# Patient Record
Sex: Male | Born: 1950 | Hispanic: Yes | Marital: Married | Smoking: Never smoker
Health system: Southern US, Community
[De-identification: ages and names within clinical notes are randomized; demographics above are authoritative.]

## PROBLEM LIST (undated history)

## (undated) DIAGNOSIS — F329 Major depressive disorder, single episode, unspecified: Secondary | ICD-10-CM

## (undated) DIAGNOSIS — M25569 Pain in unspecified knee: Secondary | ICD-10-CM

## (undated) DIAGNOSIS — F32A Depression, unspecified: Secondary | ICD-10-CM

## (undated) DIAGNOSIS — E119 Type 2 diabetes mellitus without complications: Secondary | ICD-10-CM

## (undated) HISTORY — PX: LACERATION REPAIR: SHX5168

## (undated) HISTORY — PX: NO PAST SURGERIES: SHX2092

## (undated) HISTORY — PX: HAND SURGERY: SHX662

---

## 2015-07-21 ENCOUNTER — Encounter (HOSPITAL_COMMUNITY): Payer: Self-pay | Admitting: Emergency Medicine

## 2015-07-21 ENCOUNTER — Emergency Department (HOSPITAL_COMMUNITY): Payer: Self-pay | Admitting: Anesthesiology

## 2015-07-21 ENCOUNTER — Inpatient Hospital Stay (HOSPITAL_COMMUNITY)
Admission: EM | Admit: 2015-07-21 | Discharge: 2015-07-22 | DRG: 581 | Disposition: A | Discharge status: Home / Self Care | Payer: Self-pay | Attending: General Surgery | Admitting: General Surgery

## 2015-07-21 ENCOUNTER — Emergency Department (HOSPITAL_COMMUNITY): Payer: Self-pay

## 2015-07-21 ENCOUNTER — Encounter (HOSPITAL_COMMUNITY): Admission: EM | Disposition: A | Discharge status: Home / Self Care | Payer: Self-pay | Source: Home / Self Care

## 2015-07-21 DIAGNOSIS — S1191XA Laceration without foreign body of unspecified part of neck, initial encounter: Secondary | ICD-10-CM | POA: Diagnosis present

## 2015-07-21 DIAGNOSIS — Y92009 Unspecified place in unspecified non-institutional (private) residence as the place of occurrence of the external cause: Secondary | ICD-10-CM

## 2015-07-21 DIAGNOSIS — Z23 Encounter for immunization: Secondary | ICD-10-CM

## 2015-07-21 DIAGNOSIS — S162XXA Laceration of muscle, fascia and tendon at neck level, initial encounter: Principal | ICD-10-CM | POA: Diagnosis present

## 2015-07-21 DIAGNOSIS — E119 Type 2 diabetes mellitus without complications: Secondary | ICD-10-CM | POA: Diagnosis present

## 2015-07-21 HISTORY — DX: Pain in unspecified knee: M25.569

## 2015-07-21 HISTORY — DX: Type 2 diabetes mellitus without complications: E11.9

## 2015-07-21 HISTORY — PX: COMPLEX WOUND CLOSURE: SHX6446

## 2015-07-21 LAB — I-STAT CHEM 8, ED
BUN: 21 mg/dL — AB (ref 6–20)
CALCIUM ION: 1.07 mmol/L — AB (ref 1.12–1.23)
CHLORIDE: 104 mmol/L (ref 101–111)
Creatinine, Ser: 1 mg/dL (ref 0.61–1.24)
Glucose, Bld: 206 mg/dL — ABNORMAL HIGH (ref 65–99)
HEMATOCRIT: 39 % (ref 39.0–52.0)
Hemoglobin: 13.3 g/dL (ref 13.0–17.0)
Potassium: 3.3 mmol/L — ABNORMAL LOW (ref 3.5–5.1)
SODIUM: 137 mmol/L (ref 135–145)
TCO2: 19 mmol/L (ref 0–100)

## 2015-07-21 LAB — POCT I-STAT 4, (NA,K, GLUC, HGB,HCT)
Glucose, Bld: 166 mg/dL — ABNORMAL HIGH (ref 65–99)
HEMATOCRIT: 32 % — AB (ref 39.0–52.0)
HEMOGLOBIN: 10.9 g/dL — AB (ref 13.0–17.0)
POTASSIUM: 3.8 mmol/L (ref 3.5–5.1)
SODIUM: 140 mmol/L (ref 135–145)

## 2015-07-21 LAB — TYPE AND SCREEN
ABO/RH(D): A POS
ANTIBODY SCREEN: NEGATIVE
Unit division: 0
Unit division: 0

## 2015-07-21 LAB — PREPARE FRESH FROZEN PLASMA
UNIT DIVISION: 0
UNIT DIVISION: 0

## 2015-07-21 LAB — ABO/RH: ABO/RH(D): A POS

## 2015-07-21 LAB — GLUCOSE, CAPILLARY
Glucose-Capillary: 116 mg/dL — ABNORMAL HIGH (ref 65–99)
Glucose-Capillary: 110 mg/dL — ABNORMAL HIGH (ref 65–99)

## 2015-07-21 LAB — CBG MONITORING, ED: GLUCOSE-CAPILLARY: 163 mg/dL — AB (ref 65–99)

## 2015-07-21 SURGERY — COMPLEX CLOSURE, WOUND
Anesthesia: General | Site: Neck

## 2015-07-21 MED ORDER — CEFAZOLIN SODIUM-DEXTROSE 2-3 GM-% IV SOLR
INTRAVENOUS | Status: AC
  Filled 2015-07-21: qty 50

## 2015-07-21 MED ORDER — OXYCODONE HCL 5 MG PO TABS
10.0000 mg | ORAL_TABLET | ORAL | Status: DC | PRN
  Administered 2015-07-21 – 2015-07-22 (×4): 10 mg via ORAL
  Filled 2015-07-21 (×4): qty 2

## 2015-07-21 MED ORDER — FENTANYL CITRATE (PF) 250 MCG/5ML IJ SOLN
INTRAMUSCULAR | Status: AC
  Filled 2015-07-21: qty 5

## 2015-07-21 MED ORDER — PROMETHAZINE HCL 25 MG/ML IJ SOLN: 6.2500 mg | INTRAMUSCULAR | Status: DC | PRN

## 2015-07-21 MED ORDER — FENTANYL CITRATE (PF) 250 MCG/5ML IJ SOLN
INTRAMUSCULAR | Status: DC | PRN
  Administered 2015-07-21: 100 ug via INTRAVENOUS
  Administered 2015-07-21 (×3): 50 ug via INTRAVENOUS

## 2015-07-21 MED ORDER — LACTATED RINGERS IV SOLN
INTRAVENOUS | Status: DC | PRN
  Administered 2015-07-21 (×2): via INTRAVENOUS

## 2015-07-21 MED ORDER — DEXTROSE-NACL 5-0.9 % IV SOLN
INTRAVENOUS | Status: DC
  Administered 2015-07-21 (×2): via INTRAVENOUS

## 2015-07-21 MED ORDER — INSULIN ASPART 100 UNIT/ML ~~LOC~~ SOLN
0.0000 [IU] | Freq: Three times a day (TID) | SUBCUTANEOUS | Status: DC
  Administered 2015-07-22: 2 [IU] via SUBCUTANEOUS

## 2015-07-21 MED ORDER — PROPOFOL 10 MG/ML IV BOLUS
INTRAVENOUS | Status: AC
  Filled 2015-07-21: qty 20

## 2015-07-21 MED ORDER — LIDOCAINE HCL (CARDIAC) 20 MG/ML IV SOLN
INTRAVENOUS | Status: AC
  Filled 2015-07-21: qty 5

## 2015-07-21 MED ORDER — MIDAZOLAM HCL 2 MG/2ML IJ SOLN
INTRAMUSCULAR | Status: AC
  Filled 2015-07-21: qty 2

## 2015-07-21 MED ORDER — FENTANYL CITRATE (PF) 100 MCG/2ML IJ SOLN
100.0000 ug | Freq: Once | INTRAMUSCULAR | Status: AC
  Administered 2015-07-21: 100 ug via INTRAVENOUS

## 2015-07-21 MED ORDER — CEFAZOLIN SODIUM-DEXTROSE 2-3 GM-% IV SOLR
INTRAVENOUS | Status: DC | PRN
  Administered 2015-07-21: 2 g via INTRAVENOUS

## 2015-07-21 MED ORDER — 0.9 % SODIUM CHLORIDE (POUR BTL) OPTIME
TOPICAL | Status: DC | PRN
  Administered 2015-07-21: 2000 mL

## 2015-07-21 MED ORDER — ONDANSETRON HCL 4 MG/2ML IJ SOLN
INTRAMUSCULAR | Status: AC
  Filled 2015-07-21: qty 2

## 2015-07-21 MED ORDER — TETANUS-DIPHTH-ACELL PERTUSSIS 5-2.5-18.5 LF-MCG/0.5 IM SUSP
0.5000 mL | Freq: Once | INTRAMUSCULAR | Status: AC
  Administered 2015-07-21: 0.5 mL via INTRAMUSCULAR
  Filled 2015-07-21: qty 0.5

## 2015-07-21 MED ORDER — HYDROMORPHONE HCL 1 MG/ML IJ SOLN
0.2500 mg | INTRAMUSCULAR | Status: DC | PRN
  Administered 2015-07-21 (×2): 0.5 mg via INTRAVENOUS

## 2015-07-21 MED ORDER — HYDROMORPHONE HCL 1 MG/ML IJ SOLN
1.0000 mg | INTRAMUSCULAR | Status: DC | PRN
  Administered 2015-07-21: 1 mg via INTRAVENOUS
  Filled 2015-07-21: qty 1

## 2015-07-21 MED ORDER — SUCCINYLCHOLINE CHLORIDE 20 MG/ML IJ SOLN
INTRAMUSCULAR | Status: DC | PRN
  Administered 2015-07-21: 120 mg via INTRAVENOUS

## 2015-07-21 MED ORDER — PROPOFOL 10 MG/ML IV BOLUS
INTRAVENOUS | Status: DC | PRN
  Administered 2015-07-21: 150 mg via INTRAVENOUS
  Administered 2015-07-21: 20 mg via INTRAVENOUS

## 2015-07-21 MED ORDER — SODIUM CHLORIDE 0.9 % IV SOLN
INTRAVENOUS | Status: DC | PRN
  Administered 2015-07-21: 08:00:00 via INTRAVENOUS

## 2015-07-21 MED ORDER — LACTATED RINGERS IV SOLN
INTRAVENOUS | Status: DC | PRN
  Administered 2015-07-21: 09:00:00 via INTRAVENOUS

## 2015-07-21 MED ORDER — LIDOCAINE HCL (CARDIAC) 20 MG/ML IV SOLN
INTRAVENOUS | Status: DC | PRN
  Administered 2015-07-21: 80 mg via INTRAVENOUS

## 2015-07-21 MED ORDER — ONDANSETRON HCL 4 MG PO TABS: 4.0000 mg | ORAL_TABLET | Freq: Four times a day (QID) | ORAL | Status: DC | PRN

## 2015-07-21 MED ORDER — ALBUMIN HUMAN 5 % IV SOLN
INTRAVENOUS | Status: DC | PRN
  Administered 2015-07-21: 08:00:00 via INTRAVENOUS

## 2015-07-21 MED ORDER — FENTANYL CITRATE (PF) 100 MCG/2ML IJ SOLN
INTRAMUSCULAR | Status: AC
  Administered 2015-07-21: 100 ug via INTRAVENOUS
  Filled 2015-07-21: qty 2

## 2015-07-21 MED ORDER — HYDROMORPHONE HCL 1 MG/ML IJ SOLN
INTRAMUSCULAR | Status: AC
  Filled 2015-07-21: qty 1

## 2015-07-21 MED ORDER — SUCCINYLCHOLINE CHLORIDE 20 MG/ML IJ SOLN
INTRAMUSCULAR | Status: AC
  Filled 2015-07-21: qty 1

## 2015-07-21 MED ORDER — MICROFIBRILLAR COLL HEMOSTAT EX PADS
MEDICATED_PAD | CUTANEOUS | Status: DC | PRN
  Administered 2015-07-21: 1 via TOPICAL

## 2015-07-21 MED ORDER — ONDANSETRON HCL 4 MG/2ML IJ SOLN: 4.0000 mg | Freq: Four times a day (QID) | INTRAMUSCULAR | Status: DC | PRN

## 2015-07-21 MED ORDER — ONDANSETRON HCL 4 MG/2ML IJ SOLN
INTRAMUSCULAR | Status: DC | PRN
  Administered 2015-07-21: 4 mg via INTRAVENOUS

## 2015-07-21 MED ORDER — EPHEDRINE SULFATE 50 MG/ML IJ SOLN
INTRAMUSCULAR | Status: DC | PRN
  Administered 2015-07-21: 10 mg via INTRAVENOUS

## 2015-07-21 SURGICAL SUPPLY — 13 items
COUNTER NEEDLE 20 DBL MAG RED (NEEDLE) ×4 IMPLANT
DRAPE UTILITY XL STRL (DRAPES) ×8 IMPLANT
GOWN STRL REUS W/ TWL XL LVL3 (GOWN DISPOSABLE) ×2 IMPLANT
GOWN STRL REUS W/TWL XL LVL3 (GOWN DISPOSABLE) ×2
KIT BASIN OR (CUSTOM PROCEDURE TRAY) ×4 IMPLANT
KIT ROOM TURNOVER OR (KITS) ×8 IMPLANT
LIQUID BAND (GAUZE/BANDAGES/DRESSINGS) ×4 IMPLANT
MARKER SKIN DUAL TIP RULER LAB (MISCELLANEOUS) ×4 IMPLANT
PACK CAROTID (CUSTOM PROCEDURE TRAY) ×4 IMPLANT
PAD ARMBOARD 7.5X6 YLW CONV (MISCELLANEOUS) ×8 IMPLANT
SUT MNCRL AB 3-0 PS2 18 (SUTURE) ×4 IMPLANT
SUT VIC AB 2-0 SH 18 (SUTURE) ×4 IMPLANT
SUT VIC AB 3-0 SH 18 (SUTURE) ×4 IMPLANT
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
## 2015-07-21 NOTE — Transfer of Care (Signed)
Immediate Anesthesia Transfer of Care Note  Patient: [name]  Procedure(s) Performed: Procedure(s): REPAIR OF COMPLEX 18CM NECK LACERATION (N/A)  Patient Location: PACU  Anesthesia Type:General  Level of Consciousness: awake  Airway & Oxygen Therapy: Patient Spontanous Breathing and Patient connected to nasal cannula oxygen  Post-op Assessment: Report given to RN and Post -op Vital signs reviewed and stable  Post vital signs: Reviewed and stable  Last Vitals:  Filed Vitals:   07/21/15 0745  BP: 143/76  Temp:   Resp: 14    Complications: No apparent anesthesia complications

## 2015-07-21 NOTE — ED Notes (Signed)
Spoke with pt on translator phone.  Gave him the name of family that was looking for him and he stated that it was ok for family to know that he was here.  Meds given to family, Margarita Mail, cousin.  Chaplain called to be with family.

## 2015-07-21 NOTE — Anesthesia Preprocedure Evaluation (Addendum)
Anesthesia Evaluation   Patient awake  General Assessment Comment:emergent to OR, pt. Does not speak english  Reviewed: Allergy & Precautions, Unable to perform ROS - Chart review onlyPreop documentation limited or incomplete due to emergent nature of procedure.  Airway Mallampati: II  TM Distance: >3 FB Neck ROM: Full    Dental   Pulmonary          Cardiovascular     Neuro/Psych    GI/Hepatic   Endo/Other  diabetes  Renal/GU      Musculoskeletal   Abdominal   Peds  Hematology   Anesthesia Other Findings   Reproductive/Obstetrics                            Anesthesia Physical Anesthesia Plan  ASA: III and emergent  Anesthesia Plan:    Post-op Pain Management:    Induction: Intravenous, Rapid sequence and Cricoid pressure planned  Airway Management Planned: Oral ETT  Additional Equipment:   Intra-op Plan:   Post-operative Plan: Extubation in OR  Informed Consent:   Plan Discussed with:   Anesthesia Plan Comments:         Anesthesia Quick Evaluation
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
## 2015-07-21 NOTE — Brief Op Note (Signed)
07/21/2015  9:03 AM  PATIENT:  [name]  64 y.o. male  PRE-OPERATIVE DIAGNOSIS:  traumatic neck wound  POST-OPERATIVE DIAGNOSIS:  traumatic neck wound  PROCEDURE:  Procedure(s): REPAIR OF COMPLEX 18CM NECK LACERATION (N/A)  SURGEON:  Surgeon(s) and Role:    * Harriette Bouillon, MD - Primary      ANESTHESIA:   general  EBL:  Total I/O In: 3550 [I.V.:3300; IV Piggyback:250] Out: 200 [Urine:100; Blood:100]  BLOOD ADMINISTERED:none  DRAINS: none   LOCAL MEDICATIONS USED:  NONE  SPECIMEN:  No Specimen  DISPOSITION OF SPECIMEN:  N/A  COUNTS:  YES  TOURNIQUET:  * No tourniquets in log *  DICTATION: .Other Dictation: Dictation Number [id number]  PLAN OF CARE: Admit to inpatient   PATIENT DISPOSITION:  PACU - hemodynamically stable.   Delay start of Pharmacological VTE agent (>24hrs) due to surgical blood loss or risk of bleeding: yes

## 2015-07-21 NOTE — Anesthesia Postprocedure Evaluation (Signed)
Anesthesia Post Note  Patient: [name]  Procedure(s) Performed: Procedure(s) (LRB): REPAIR OF COMPLEX 18CM NECK LACERATION (N/A)  Anesthesia type: general  Patient location: PACU  Post pain: Pain level controlled  Post assessment: Patient's Cardiovascular Status Stable  Last Vitals:  Filed Vitals:   07/21/15 1009  BP: 160/80  Pulse: 97  Temp: 37 C  Resp: 18    Post vital signs: Reviewed and stable  Level of consciousness: sedated  Complications: No apparent anesthesia complications
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
## 2015-07-22 ENCOUNTER — Encounter (HOSPITAL_COMMUNITY): Payer: Self-pay

## 2015-07-22 LAB — CBC
HCT: 35.6 % — ABNORMAL LOW (ref 39.0–52.0)
Hemoglobin: 11.9 g/dL — ABNORMAL LOW (ref 13.0–17.0)
MCH: 30.7 pg (ref 26.0–34.0)
MCHC: 33.4 g/dL (ref 30.0–36.0)
MCV: 92 fL (ref 78.0–100.0)
PLATELETS: 181 10*3/uL (ref 150–400)
RBC: 3.87 MIL/uL — ABNORMAL LOW (ref 4.22–5.81)
RDW: 13.5 % (ref 11.5–15.5)
WBC: 5.9 10*3/uL (ref 4.0–10.5)

## 2015-07-22 LAB — GLUCOSE, CAPILLARY
GLUCOSE-CAPILLARY: 102 mg/dL — AB (ref 65–99)
Glucose-Capillary: 134 mg/dL — ABNORMAL HIGH (ref 65–99)

## 2015-07-22 MED ORDER — OXYCODONE HCL 10 MG PO TABS: 10.0000 mg | ORAL_TABLET | ORAL | Status: DC | PRN
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
## 2015-07-22 NOTE — Discharge Instructions (Signed)
Apply neosporin to incision

## 2015-07-23 ENCOUNTER — Encounter (HOSPITAL_COMMUNITY): Payer: Self-pay | Admitting: Surgery

## 2015-07-23 LAB — BLOOD PRODUCT ORDER (VERBAL) VERIFICATION

## 2015-07-27 NOTE — Addendum Note (Signed)
Addendum  created 07/27/15 1211 by Atilano Ina, CRNA   Modules edited: Anesthesia Events, Narrator   Narrator:  Narrator: Event Log Edited

## 2015-08-08 ENCOUNTER — Ambulatory Visit (HOSPITAL_BASED_OUTPATIENT_CLINIC_OR_DEPARTMENT_OTHER): Admit: 2015-08-08 | Payer: Self-pay | Admitting: Orthopedic Surgery

## 2015-08-08 ENCOUNTER — Ambulatory Visit (HOSPITAL_BASED_OUTPATIENT_CLINIC_OR_DEPARTMENT_OTHER): Payer: Self-pay | Admitting: Anesthesiology

## 2015-08-08 ENCOUNTER — Emergency Department (HOSPITAL_COMMUNITY): Payer: Self-pay

## 2015-08-08 ENCOUNTER — Encounter (HOSPITAL_COMMUNITY): Payer: Self-pay | Admitting: Emergency Medicine

## 2015-08-08 ENCOUNTER — Encounter (HOSPITAL_BASED_OUTPATIENT_CLINIC_OR_DEPARTMENT_OTHER): Admission: EM | Disposition: A | Discharge status: Home / Self Care | Payer: Self-pay | Source: Home / Self Care

## 2015-08-08 ENCOUNTER — Ambulatory Visit (HOSPITAL_COMMUNITY)
Admission: EM | Admit: 2015-08-08 | Discharge: 2015-08-08 | Disposition: A | Discharge status: Home / Self Care | Payer: Self-pay | Attending: Orthopedic Surgery | Admitting: Orthopedic Surgery

## 2015-08-08 DIAGNOSIS — L02511 Cutaneous abscess of right hand: Secondary | ICD-10-CM | POA: Insufficient documentation

## 2015-08-08 DIAGNOSIS — E119 Type 2 diabetes mellitus without complications: Secondary | ICD-10-CM | POA: Insufficient documentation

## 2015-08-08 DIAGNOSIS — I10 Essential (primary) hypertension: Secondary | ICD-10-CM | POA: Insufficient documentation

## 2015-08-08 DIAGNOSIS — L089 Local infection of the skin and subcutaneous tissue, unspecified: Secondary | ICD-10-CM

## 2015-08-08 HISTORY — DX: Depression, unspecified: F32.A

## 2015-08-08 HISTORY — DX: Major depressive disorder, single episode, unspecified: F32.9

## 2015-08-08 HISTORY — PX: I & D EXTREMITY: SHX5045

## 2015-08-08 LAB — CBC WITH DIFFERENTIAL/PLATELET
BASOS ABS: 0 10*3/uL (ref 0.0–0.1)
BASOS PCT: 0 %
EOS ABS: 0.1 10*3/uL (ref 0.0–0.7)
EOS PCT: 2 %
HCT: 37.5 % — ABNORMAL LOW (ref 39.0–52.0)
Hemoglobin: 12.6 g/dL — ABNORMAL LOW (ref 13.0–17.0)
Lymphocytes Relative: 25 %
Lymphs Abs: 1.3 10*3/uL (ref 0.7–4.0)
MCH: 30.1 pg (ref 26.0–34.0)
MCHC: 33.6 g/dL (ref 30.0–36.0)
MCV: 89.7 fL (ref 78.0–100.0)
MONO ABS: 0.4 10*3/uL (ref 0.1–1.0)
Monocytes Relative: 8 %
Neutro Abs: 3.4 10*3/uL (ref 1.7–7.7)
Neutrophils Relative %: 65 %
PLATELETS: 314 10*3/uL (ref 150–400)
RBC: 4.18 MIL/uL — ABNORMAL LOW (ref 4.22–5.81)
RDW: 13 % (ref 11.5–15.5)
WBC: 5.3 10*3/uL (ref 4.0–10.5)

## 2015-08-08 LAB — POCT I-STAT, CHEM 8
BUN: 21 mg/dL — ABNORMAL HIGH (ref 6–20)
Calcium, Ion: 1.07 mmol/L — ABNORMAL LOW (ref 1.13–1.30)
Chloride: 104 mmol/L (ref 101–111)
Creatinine, Ser: 1 mg/dL (ref 0.61–1.24)
GLUCOSE: 206 mg/dL — AB (ref 65–99)
HCT: 39 % (ref 39.0–52.0)
HEMOGLOBIN: 13.3 g/dL (ref 13.0–17.0)
POTASSIUM: 3.3 mmol/L — AB (ref 3.5–5.1)
Sodium: 137 mmol/L (ref 135–145)
TCO2: 19 mmol/L (ref 0–100)

## 2015-08-08 LAB — BASIC METABOLIC PANEL
ANION GAP: 8 (ref 5–15)
BUN: 18 mg/dL (ref 6–20)
CALCIUM: 9.2 mg/dL (ref 8.9–10.3)
CO2: 27 mmol/L (ref 22–32)
Chloride: 101 mmol/L (ref 101–111)
Creatinine, Ser: 1.01 mg/dL (ref 0.61–1.24)
GLUCOSE: 90 mg/dL (ref 65–99)
Potassium: 3.8 mmol/L (ref 3.5–5.1)
Sodium: 136 mmol/L (ref 135–145)

## 2015-08-08 LAB — GLUCOSE, CAPILLARY: GLUCOSE-CAPILLARY: 93 mg/dL (ref 65–99)

## 2015-08-08 SURGERY — IRRIGATION AND DEBRIDEMENT EXTREMITY
Anesthesia: General | Site: Finger | Laterality: Right

## 2015-08-08 MED ORDER — OXYCODONE HCL 5 MG PO TABS: 10.0000 mg | ORAL_TABLET | Freq: Once | ORAL | Status: DC | PRN

## 2015-08-08 MED ORDER — DEXAMETHASONE SODIUM PHOSPHATE 10 MG/ML IJ SOLN
INTRAMUSCULAR | Status: DC | PRN
  Administered 2015-08-08: 4 mg via INTRAVENOUS

## 2015-08-08 MED ORDER — MIDAZOLAM HCL 2 MG/2ML IJ SOLN
INTRAMUSCULAR | Status: AC
  Filled 2015-08-08: qty 4

## 2015-08-08 MED ORDER — LIDOCAINE HCL (CARDIAC) 20 MG/ML IV SOLN
INTRAVENOUS | Status: DC | PRN
  Administered 2015-08-08: 50 mg via INTRAVENOUS

## 2015-08-08 MED ORDER — MIDAZOLAM HCL 2 MG/2ML IJ SOLN
1.0000 mg | INTRAMUSCULAR | Status: DC | PRN
  Administered 2015-08-08: 2 mg via INTRAVENOUS

## 2015-08-08 MED ORDER — AMOXICILLIN-POT CLAVULANATE 875-125 MG PO TABS: 1.0000 | ORAL_TABLET | Freq: Two times a day (BID) | ORAL | Status: AC

## 2015-08-08 MED ORDER — ONDANSETRON HCL 4 MG/2ML IJ SOLN
INTRAMUSCULAR | Status: DC | PRN
  Administered 2015-08-08: 4 mg via INTRAVENOUS

## 2015-08-08 MED ORDER — BUPIVACAINE HCL (PF) 0.25 % IJ SOLN
INTRAMUSCULAR | Status: DC | PRN
  Administered 2015-08-08: 10 mL

## 2015-08-08 MED ORDER — FENTANYL CITRATE (PF) 100 MCG/2ML IJ SOLN
INTRAMUSCULAR | Status: AC
  Filled 2015-08-08: qty 4

## 2015-08-08 MED ORDER — OXYCODONE-ACETAMINOPHEN 5-325 MG PO TABS: ORAL_TABLET | ORAL | Status: AC

## 2015-08-08 MED ORDER — GLYCOPYRROLATE 0.2 MG/ML IJ SOLN: 0.2000 mg | Freq: Once | INTRAMUSCULAR | Status: DC | PRN

## 2015-08-08 MED ORDER — PROPOFOL 10 MG/ML IV BOLUS
INTRAVENOUS | Status: DC | PRN
  Administered 2015-08-08: 150 mg via INTRAVENOUS

## 2015-08-08 MED ORDER — AMPICILLIN-SULBACTAM SODIUM 3 (2-1) G IJ SOLR
INTRAMUSCULAR | Status: AC
  Filled 2015-08-08: qty 3

## 2015-08-08 MED ORDER — PROMETHAZINE HCL 25 MG/ML IJ SOLN: 6.2500 mg | INTRAMUSCULAR | Status: DC | PRN

## 2015-08-08 MED ORDER — AMPICILLIN-SULBACTAM SODIUM 3 (2-1) G IJ SOLR
3.0000 g | INTRAMUSCULAR | Status: DC | PRN
  Administered 2015-08-08: 3 g via INTRAVENOUS

## 2015-08-08 MED ORDER — FENTANYL CITRATE (PF) 100 MCG/2ML IJ SOLN: 25.0000 ug | INTRAMUSCULAR | Status: DC | PRN

## 2015-08-08 MED ORDER — LACTATED RINGERS IV SOLN
INTRAVENOUS | Status: DC
  Administered 2015-08-08 (×2): via INTRAVENOUS

## 2015-08-08 MED ORDER — SODIUM CHLORIDE 0.9 % IV SOLN
3.0000 g | Freq: Once | INTRAVENOUS | Status: AC
  Administered 2015-08-08: 3 g via INTRAVENOUS
  Filled 2015-08-08 (×2): qty 3

## 2015-08-08 MED ORDER — SCOPOLAMINE 1 MG/3DAYS TD PT72: 1.0000 | MEDICATED_PATCH | Freq: Once | TRANSDERMAL | Status: DC | PRN

## 2015-08-08 MED ORDER — OXYCODONE HCL 5 MG/5ML PO SOLN: 5.0000 mg | Freq: Once | ORAL | Status: DC | PRN

## 2015-08-08 MED ORDER — FENTANYL CITRATE (PF) 100 MCG/2ML IJ SOLN: 50.0000 ug | INTRAMUSCULAR | Status: DC | PRN

## 2015-08-08 MED ORDER — FENTANYL CITRATE (PF) 100 MCG/2ML IJ SOLN
INTRAMUSCULAR | Status: DC | PRN
  Administered 2015-08-08: 100 ug via INTRAVENOUS

## 2015-08-08 SURGICAL SUPPLY — 48 items
BAG DECANTER FOR FLEXI CONT (MISCELLANEOUS) IMPLANT
BANDAGE ELASTIC 3 VELCRO ST LF (GAUZE/BANDAGES/DRESSINGS) IMPLANT
BLADE MINI RND TIP GREEN BEAV (BLADE) IMPLANT
BLADE SURG 15 STRL LF DISP TIS (BLADE) ×2 IMPLANT
BLADE SURG 15 STRL SS (BLADE) ×4
BNDG COHESIVE 1X5 TAN STRL LF (GAUZE/BANDAGES/DRESSINGS) ×3 IMPLANT
BNDG ELASTIC 2 VLCR STRL LF (GAUZE/BANDAGES/DRESSINGS) IMPLANT
BNDG ESMARK 4X9 LF (GAUZE/BANDAGES/DRESSINGS) ×3 IMPLANT
BNDG GAUZE 1X2.1 STRL (MISCELLANEOUS) IMPLANT
BNDG GAUZE ELAST 4 BULKY (GAUZE/BANDAGES/DRESSINGS) ×3 IMPLANT
BRUSH SCRUB EZ PLAIN DRY (MISCELLANEOUS) ×3 IMPLANT
CHLORAPREP W/TINT 26ML (MISCELLANEOUS) IMPLANT
CORDS BIPOLAR (ELECTRODE) IMPLANT
COVER BACK TABLE 60X90IN (DRAPES) ×3 IMPLANT
COVER MAYO STAND STRL (DRAPES) ×3 IMPLANT
CUFF TOURNIQUET SINGLE 18IN (TOURNIQUET CUFF) ×3 IMPLANT
DRAPE EXTREMITY T 121X128X90 (DRAPE) ×3 IMPLANT
DRAPE SURG 17X23 STRL (DRAPES) ×3 IMPLANT
GAUZE PACKING IODOFORM 1/4X15 (GAUZE/BANDAGES/DRESSINGS) ×3 IMPLANT
GAUZE SPONGE 4X4 12PLY STRL (GAUZE/BANDAGES/DRESSINGS) ×3 IMPLANT
GAUZE XEROFORM 1X8 LF (GAUZE/BANDAGES/DRESSINGS) ×3 IMPLANT
GLOVE BIO SURGEON STRL SZ7.5 (GLOVE) ×3 IMPLANT
GLOVE BIOGEL M STRL SZ7.5 (GLOVE) ×3 IMPLANT
GLOVE BIOGEL PI IND STRL 8 (GLOVE) ×2 IMPLANT
GLOVE BIOGEL PI INDICATOR 8 (GLOVE) ×4
GOWN STRL REUS W/ TWL LRG LVL3 (GOWN DISPOSABLE) IMPLANT
GOWN STRL REUS W/TWL LRG LVL3 (GOWN DISPOSABLE)
GOWN STRL REUS W/TWL XL LVL3 (GOWN DISPOSABLE) ×6 IMPLANT
LOOP VESSEL MAXI BLUE (MISCELLANEOUS) IMPLANT
NEEDLE HYPO 25X1 1.5 SAFETY (NEEDLE) IMPLANT
NS IRRIG 1000ML POUR BTL (IV SOLUTION) ×3 IMPLANT
PACK BASIN DAY SURGERY FS (CUSTOM PROCEDURE TRAY) ×3 IMPLANT
PAD CAST 3X4 CTTN HI CHSV (CAST SUPPLIES) IMPLANT
PADDING CAST ABS 4INX4YD NS (CAST SUPPLIES) ×2
PADDING CAST ABS COTTON 4X4 ST (CAST SUPPLIES) ×1 IMPLANT
PADDING CAST COTTON 3X4 STRL (CAST SUPPLIES)
SPLINT PLASTER CAST XFAST 3X15 (CAST SUPPLIES) IMPLANT
SPLINT PLASTER XTRA FASTSET 3X (CAST SUPPLIES)
STOCKINETTE 4X48 STRL (DRAPES) ×3 IMPLANT
SUT ETHILON 4 0 PS 2 18 (SUTURE) IMPLANT
SWAB COLLECTION DEVICE MRSA (MISCELLANEOUS) ×3 IMPLANT
SYR BULB 3OZ (MISCELLANEOUS) ×3 IMPLANT
SYR CONTROL 10ML LL (SYRINGE) ×3 IMPLANT
TOWEL OR 17X24 6PK STRL BLUE (TOWEL DISPOSABLE) ×6 IMPLANT
TRAY DSU PREP LF (CUSTOM PROCEDURE TRAY) ×3 IMPLANT
TUBE ANAEROBIC SPECIMEN COL (MISCELLANEOUS) IMPLANT
TUBE FEEDING 5FR 15 INCH (TUBING) IMPLANT
UNDERPAD 30X30 (UNDERPADS AND DIAPERS) ×3 IMPLANT

## 2015-08-08 NOTE — Op Note (Signed)
[id number] 

## 2015-08-08 NOTE — Anesthesia Procedure Notes (Signed)
Procedure Name: LMA Insertion Date/Time: 08/08/2015 4:48 PM Performed by: Zenia Resides D Pre-anesthesia Checklist: Patient identified, Emergency Drugs available, Suction available and Patient being monitored Patient Re-evaluated:Patient Re-evaluated prior to inductionOxygen Delivery Method: Circle System Utilized Preoxygenation: Pre-oxygenation with 100% oxygen Intubation Type: IV induction Ventilation: Mask ventilation without difficulty LMA: LMA inserted LMA Size: 4.0 Number of attempts: 1 Airway Equipment and Method: bite block Placement Confirmation: positive ETCO2 Tube secured with: Tape Dental Injury: Teeth and Oropharynx as per pre-operative assessment

## 2015-08-08 NOTE — Transfer of Care (Signed)
Immediate Anesthesia Transfer of Care Note  Patient: Dreux Mcgroarty  Procedure(s) Performed: Procedure(s): IRRIGATION AND DEBRIDEMENT OF RIGHT INDEX FINGER (Right)  Patient Location: PACU  Anesthesia Type:General  Level of Consciousness: awake  Airway & Oxygen Therapy: Patient Spontanous Breathing and Patient connected to face mask oxygen  Post-op Assessment: Report given to RN and Post -op Vital signs reviewed and stable  Post vital signs: Reviewed and stable  Last Vitals:  Filed Vitals:   08/08/15 1446  BP: 131/71  Pulse: 73  Temp: 36.6 C  Resp: 16    Complications: No apparent anesthesia complications

## 2015-08-08 NOTE — Brief Op Note (Signed)
08/08/2015  5:16 PM  PATIENT:  Simone Curia  64 y.o. male  PRE-OPERATIVE DIAGNOSIS:  Abscess of right index  POST-OPERATIVE DIAGNOSIS:  Abscess of right index  PROCEDURE:  Procedure(s): IRRIGATION AND DEBRIDEMENT OF RIGHT INDEX FINGER (Right)  SURGEON:  Surgeon(s) and Role:    * Betha Loa, MD - Primary  PHYSICIAN ASSISTANT:   ASSISTANTS: none   ANESTHESIA:   general  EBL:     BLOOD ADMINISTERED:none  DRAINS: iodoform packing  LOCAL MEDICATIONS USED:  MARCAINE     SPECIMEN:  Source of Specimen:  right index finger  DISPOSITION OF SPECIMEN:  micro  COUNTS:  YES  TOURNIQUET:   Total Tourniquet Time Documented: Upper Arm (Right) - 15 minutes Total: Upper Arm (Right) - 15 minutes   DICTATION: .Other Dictation: Dictation Number [id number]  PLAN OF CARE: Discharge to home after PACU  PATIENT DISPOSITION:  PACU - hemodynamically stable.
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
## 2015-08-08 NOTE — Anesthesia Postprocedure Evaluation (Signed)
  Anesthesia Post-op Note  Patient: [name]  Procedure(s) Performed: Procedure(s) (LRB): IRRIGATION AND DEBRIDEMENT OF RIGHT INDEX FINGER (Right)  Patient Location: PACU  Anesthesia Type: General  Level of Consciousness: awake and alert   Airway and Oxygen Therapy: Patient Spontanous Breathing  Post-op Pain: mild  Post-op Assessment: Post-op Vital signs reviewed, Patient's Cardiovascular Status Stable, Respiratory Function Stable, Patent Airway and No signs of Nausea or vomiting  Last Vitals:  Filed Vitals:   08/08/15 1740  BP:   Pulse: 67  Temp:   Resp: 14    Post-op Vital Signs: stable   Complications: No apparent anesthesia complications

## 2015-08-08 NOTE — Discharge Instructions (Addendum)
Please go to the [facility] after leaving the hospital for further treatment for your hand with the hand surgeon.  Please return to the Emergency Department if symptoms worsen or new onset of fever.     Post Anesthesia Home Care Instructions  Activity: Get plenty of rest for the remainder of the day. A responsible adult should stay with you for 24 hours following the procedure.  For the next 24 hours, DO NOT: -Drive a car -Advertising copywriter -Drink alcoholic beverages -Take any medication unless instructed by your physician -Make any legal decisions or sign important papers.  Meals: Start with liquid foods such as gelatin or soup. Progress to regular foods as tolerated. Avoid greasy, spicy, heavy foods. If nausea and/or vomiting occur, drink only clear liquids until the nausea and/or vomiting subsides. Call your physician if vomiting continues.  Special Instructions/Symptoms: Your throat may feel dry or sore from the anesthesia or the breathing tube placed in your throat during surgery. If this causes discomfort, gargle with warm salt water. The discomfort should disappear within 24 hours.  If you had a scopolamine patch placed behind your ear for the management of post- operative nausea and/or vomiting:  1. The medication in the patch is effective for 72 hours, after which it should be removed.  Wrap patch in a tissue and discard in the trash. Wash hands thoroughly with soap and water. 2. You may remove the patch earlier than 72 hours if you experience unpleasant side effects which may include dry mouth, dizziness or visual disturbances. 3. Avoid touching the patch. Wash your hands with soap and water after contact with the patch.   Call your surgeon if you experience:   1.  Fever over 101.0. 2.  Inability to urinate. 3.  Nausea and/or vomiting. 4.  Extreme swelling or bruising at the surgical site. 5.  Continued bleeding from the incision. 6.  Increased pain, redness or  drainage from the incision. 7.  Problems related to your pain medication. 8. Any change in color, movement and/or sensation 9. Any problems and/or concerns   Hand Center Instructions Hand Surgery  Wound Care: Keep your hand elevated above the level of your heart.  Do not allow it to dangle by your side.  Keep the dressing dry and do not remove it unless your doctor advises you to do so.  He will usually change it at the time of your post-op visit.  Moving your fingers is advised to stimulate circulation but will depend on the site of your surgery.  If you have a splint applied, your doctor will advise you regarding movement.  Activity: Do not drive or operate machinery today.  Rest today and then you may return to your normal activity and work as indicated by your physician.  Diet:  Drink liquids today or eat a light diet.  You may resume a regular diet tomorrow.    General expectations: Pain for two to three days. Fingers may become slightly swollen.  Call your doctor if any of the following occur: Severe pain not relieved by pain medication. Elevated temperature. Dressing soaked with blood. Inability to move fingers. White or bluish color to fingers.
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
## 2015-08-08 NOTE — ED Notes (Signed)
NAD at this time. Pt is stable and going to the surgery center.

## 2015-08-09 ENCOUNTER — Encounter (HOSPITAL_BASED_OUTPATIENT_CLINIC_OR_DEPARTMENT_OTHER): Payer: Self-pay | Admitting: Orthopedic Surgery

## 2015-08-11 ENCOUNTER — Encounter (HOSPITAL_COMMUNITY): Payer: Self-pay | Admitting: *Deleted

## 2015-08-11 ENCOUNTER — Emergency Department (HOSPITAL_COMMUNITY)
Admission: EM | Admit: 2015-08-11 | Discharge: 2015-08-11 | Disposition: A | Discharge status: Home / Self Care | Payer: Self-pay | Attending: Emergency Medicine | Admitting: Emergency Medicine

## 2015-08-11 DIAGNOSIS — Z792 Long term (current) use of antibiotics: Secondary | ICD-10-CM | POA: Insufficient documentation

## 2015-08-11 DIAGNOSIS — Z8659 Personal history of other mental and behavioral disorders: Secondary | ICD-10-CM | POA: Insufficient documentation

## 2015-08-11 DIAGNOSIS — L02511 Cutaneous abscess of right hand: Secondary | ICD-10-CM | POA: Insufficient documentation

## 2015-08-11 DIAGNOSIS — Z79899 Other long term (current) drug therapy: Secondary | ICD-10-CM | POA: Insufficient documentation

## 2015-08-11 DIAGNOSIS — E119 Type 2 diabetes mellitus without complications: Secondary | ICD-10-CM | POA: Insufficient documentation

## 2015-08-11 DIAGNOSIS — Z7982 Long term (current) use of aspirin: Secondary | ICD-10-CM | POA: Insufficient documentation

## 2015-08-11 MED ORDER — OXYCODONE HCL 5 MG PO TABS: 5.0000 mg | ORAL_TABLET | ORAL | Status: AC | PRN

## 2015-08-11 MED ORDER — OXYCODONE HCL 5 MG PO TABS
5.0000 mg | ORAL_TABLET | Freq: Once | ORAL | Status: AC
  Administered 2015-08-11: 5 mg via ORAL
  Filled 2015-08-11: qty 1
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
## 2015-08-11 NOTE — ED Notes (Signed)
PT reported he removed packing ,because he wanted to look at finger. Pt reported pain increased after he removed packing.

## 2015-08-11 NOTE — ED Notes (Addendum)
PT reports increased pain in RT index finger post -op 08-08-15. PT reports having an appt on Monday with surgeon but has increased pain today.

## 2015-08-11 NOTE — ED Notes (Signed)
PT reports he took 1 percocet 5/325 at 0530 and 0930 repeated with out pain relief.

## 2015-08-11 NOTE — ED Notes (Signed)
Declined W/C at D/C and was escorted to lobby by RN. 

## 2015-08-11 NOTE — ED Notes (Signed)
Dressing reapplied to RT index finger.

## 2015-08-11 NOTE — Discharge Instructions (Signed)
Absceso - Cuidados posteriores  (Abscess, Care After) Keep your follow-up appointment with Dr. Merlyn Lot tomorrow at 9:30 AM. Do not operate heavy machinery or drive while using pain medication. Un absceso (tambin llamado divieso o fornculo) es una zona infectada que contiene pus. Los signos y los sntomas de un absceso Environmental education officer, sensibilidad, enrojecimiento o Civil engineer, contracting, o puede sentir una zona blanda que se desplaza debajo de la piel. Un absceso puede presentarse en cualquier parte del cuerpo. La infeccin puede extenderse a los tejidos circundantes y causar celulitis. El cirujano le ha practicado un corte (incisin) sobre el absceso y el pus ha drenado. En ese espacio le han colocado una gasa para facilitar el drenaje que permitir que la cavidad se cure desde Geophysical data processor exterior. El absceso puede doler durante 5 a [address]. La mayor parte de las personas no presentan fiebre. Si el absceso fue controlado en una etapa temprana, puede que no est localizado y entonces es posible que no lo drenen. En este caso, en necesario que programe otra cita si no mejora por s mismo o con medicacin.  INSTRUCCIONES PARA EL CUIDADO EN EL HOGAR   Solo tome medicamentos de venta libre o recetados para Chief Technology Officer, Dentist o la fiebre, segn las indicaciones del mdico.  En el momento del bao, remoje y Engineer, mining retire la gasa o la compresa con yodoformo, al [employer] o segn le haya indicado el profesional que lo asiste. Luego puede lavar la herida suavemente con [place]. Luego vuelva a Astronomer segn se lo haya indicado el profesional que lo asiste. SOLICITE ATENCIN MDICA DE INMEDIATO SI:   Presenta dolor intenso, hinchazn, enrojecimiento, drena lquido o sangra en la regin de la herida.  Aparecen signos de infeccin generalizada, incluyendo dolores musculares, escalofros, fiebre, o una sensacin general de Dentist.  La temperatura oral le sube a ms de 38,9 C (102 F) y no puede  controlarla con medicamentos. Consulte al mdico para un nuevo control o en caso que presente alguno de los sntomas descritos ms [place]. Si le recetan medicamentos (antibiticos), tmelos tal como se le indic.  Document Released: 10/19/2012 [facility] Patient Information 2015 [place], [place]. This information is not intended to replace advice given to you by your health care provider. Make sure you discuss any questions you have with your health care provider.

## 2015-08-13 LAB — CULTURE, ROUTINE-ABSCESS

## 2015-08-13 LAB — ANAEROBIC CULTURE

## 2017-06-04 IMAGING — DX DG HAND 2V*R*
2 series · 2 of 2 positions shown · non-contrast
Comparison: None.

CLINICAL DATA: RIGHT index finger infection. Laceration 2 weeks
prior

EXAM:
RIGHT HAND - 2 VIEW

[x hand pa right]
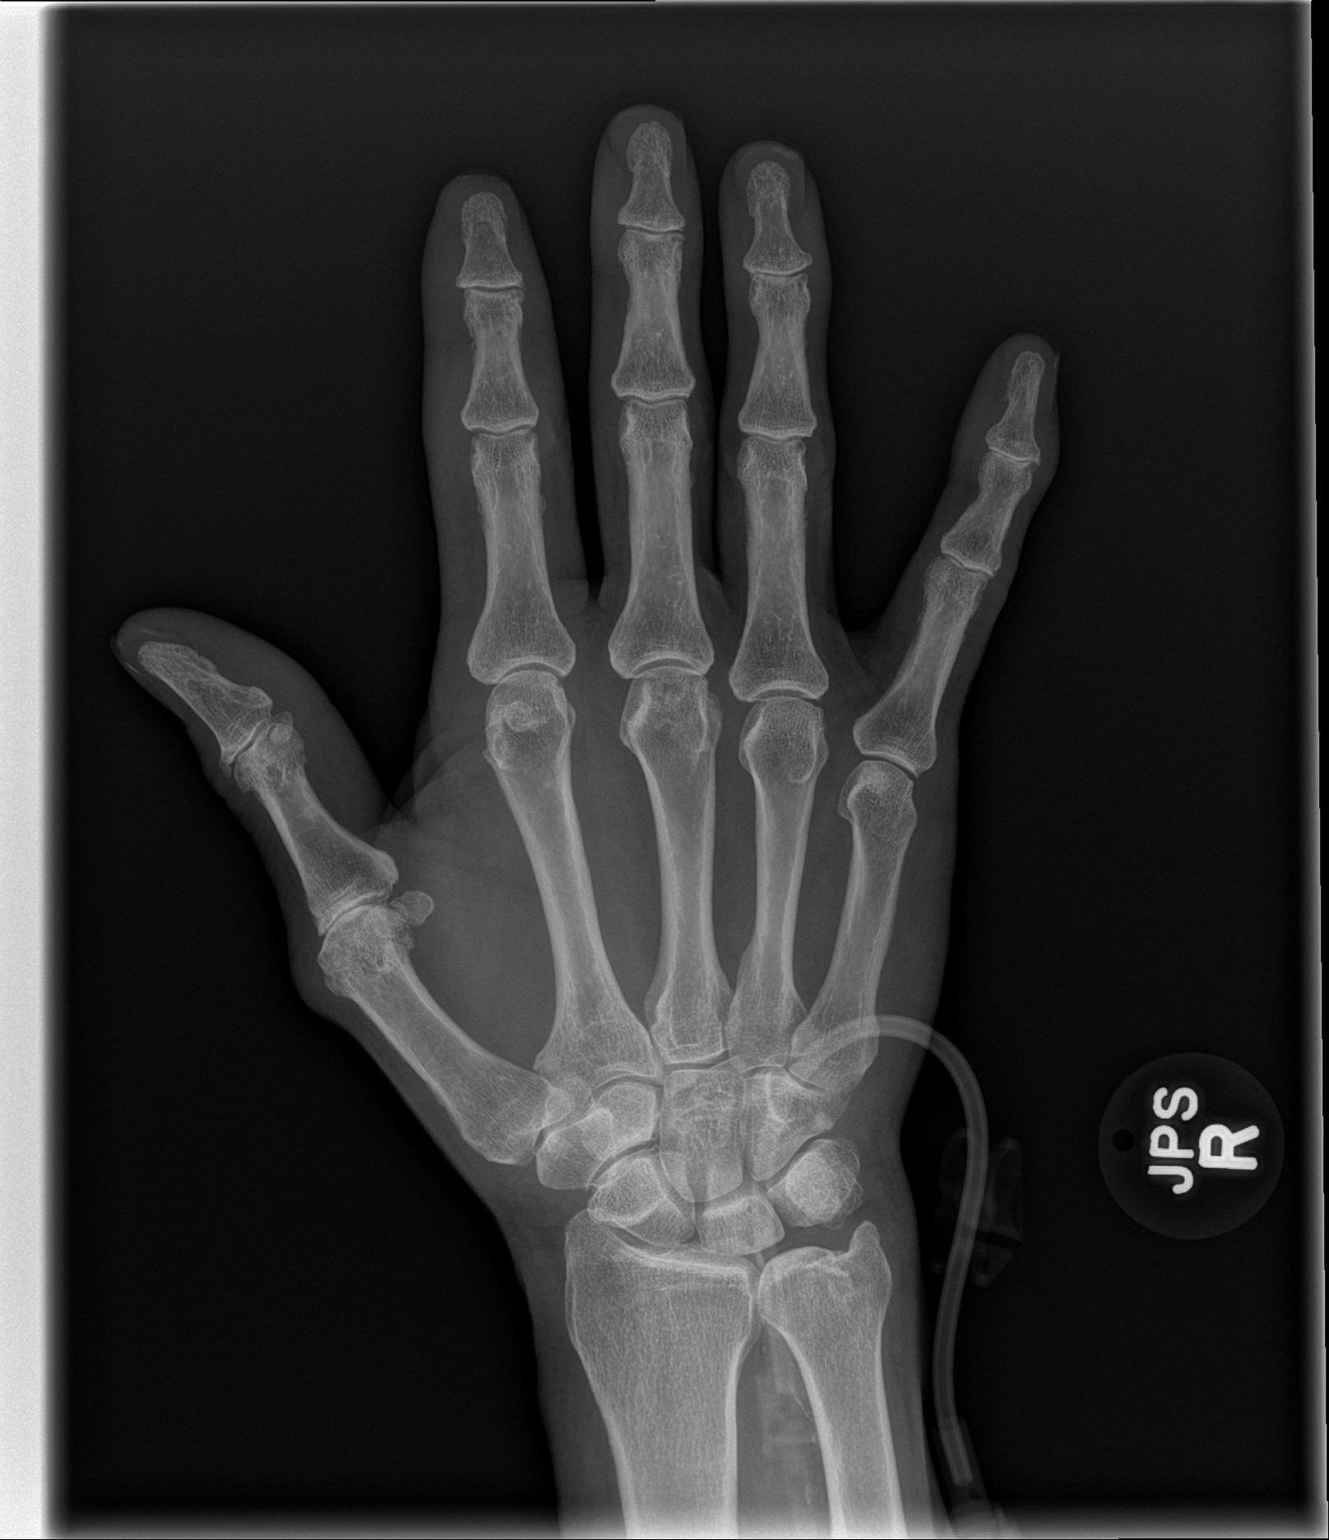

[x hand lat right]
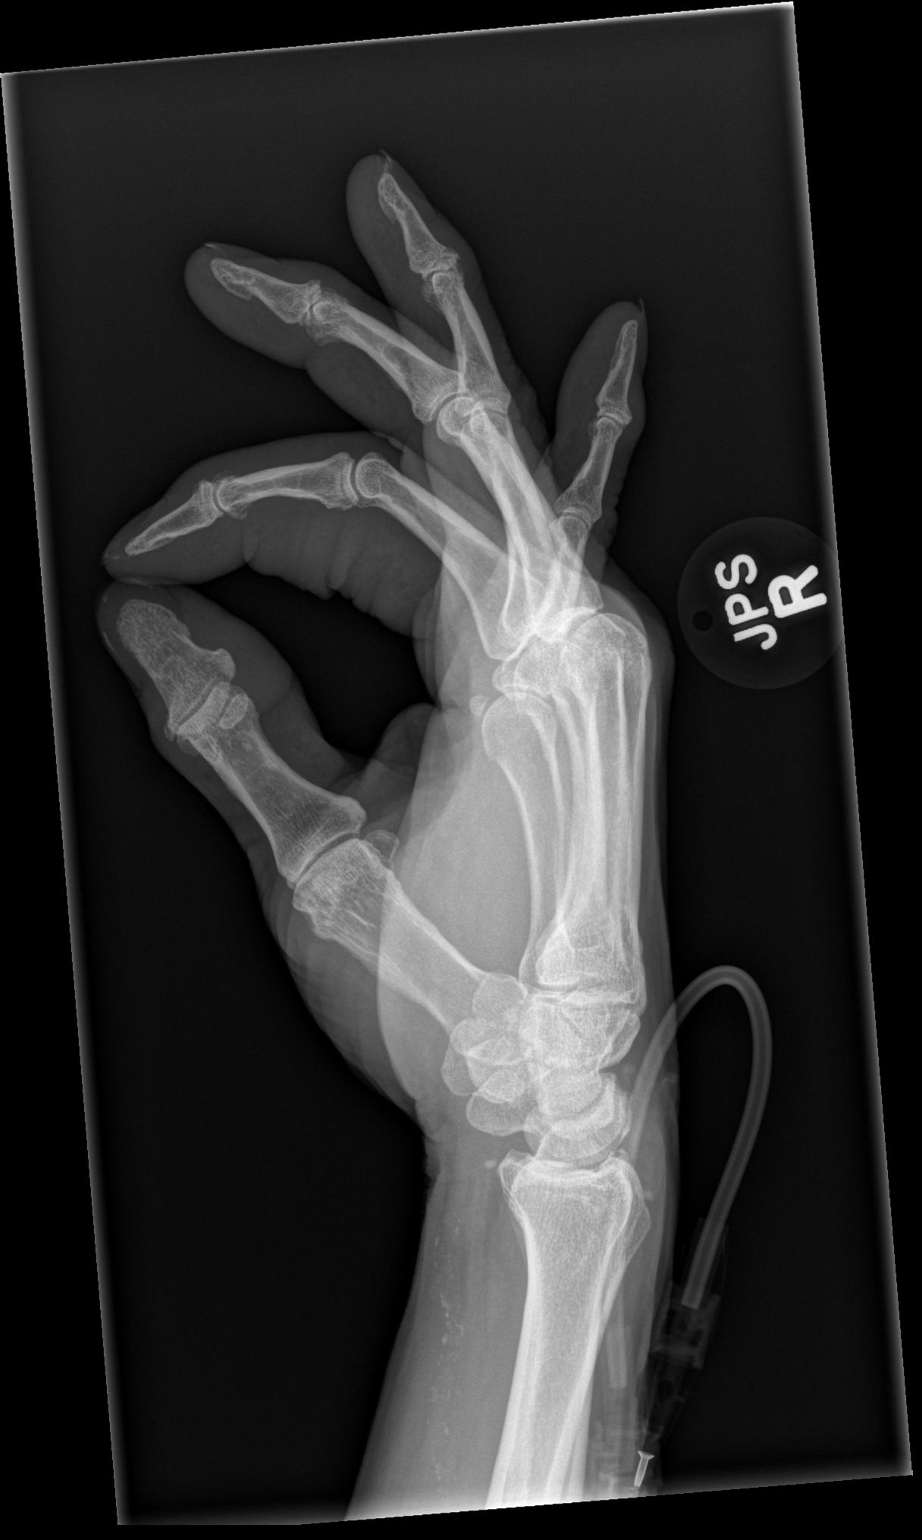

[2 of 2 positions shown; findings below may reference images not displayed]

FINDINGS: No evidence of fracture or dislocation of the second finger. No
foreign body. Soft tissue swelling.
IMPRESSION: No fracture or foreign body. Mild soft tissue swelling of the second
digit.
